# Patient Record
Sex: Female | Born: 1995
Health system: Southern US, Community
[De-identification: ages and names within clinical notes are randomized; demographics above are authoritative.]

## PROBLEM LIST (undated history)

## (undated) DIAGNOSIS — D649 Anemia, unspecified: Secondary | ICD-10-CM

## (undated) DIAGNOSIS — E282 Polycystic ovarian syndrome: Secondary | ICD-10-CM

## (undated) HISTORY — DX: Anemia, unspecified: D64.9

## (undated) HISTORY — DX: Polycystic ovarian syndrome: E28.2

---
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
## 2014-11-27 DIAGNOSIS — D649 Anemia, unspecified: Secondary | ICD-10-CM

## 2014-11-27 HISTORY — DX: Anemia, unspecified: D64.9

## 2017-05-16 ENCOUNTER — Ambulatory Visit (INDEPENDENT_AMBULATORY_CARE_PROVIDER_SITE_OTHER): Payer: BLUE CROSS/BLUE SHIELD | Admitting: Sports Medicine

## 2017-05-16 ENCOUNTER — Encounter: Payer: Self-pay | Admitting: Sports Medicine

## 2017-05-16 ENCOUNTER — Ambulatory Visit (INDEPENDENT_AMBULATORY_CARE_PROVIDER_SITE_OTHER): Payer: BLUE CROSS/BLUE SHIELD

## 2017-05-16 VITALS — BP 120/80 | HR 98 | Ht 66.0 in | Wt 166.0 lb

## 2017-05-16 DIAGNOSIS — S99911A Unspecified injury of right ankle, initial encounter: Secondary | ICD-10-CM

## 2017-05-16 DIAGNOSIS — S99921A Unspecified injury of right foot, initial encounter: Secondary | ICD-10-CM | POA: Diagnosis not present

## 2017-05-16 DIAGNOSIS — S93491A Sprain of other ligament of right ankle, initial encounter: Secondary | ICD-10-CM | POA: Diagnosis not present

## 2017-05-16 LAB — POCT URINE PREGNANCY: Preg Test, Ur: NEGATIVE

## 2017-05-16 NOTE — Progress Notes (Signed)
OFFICE VISIT NOTE Sherry FellsMichael D. Delorise Shinerigby, DO  [facility] [place] [phone]  Sherry Ruiz - 21 y.o. female MRN [id number]  Date of birth: 09/30/1996  Visit Date: 05/16/2017  PCP: No primary care provider on file.   Referred by: No ref. provider found  Clovis CaoAutumn McNeil, cma acting as scribe for Dr. Berline Choughigby.  SUBJECTIVE:   Chief Complaint  Patient presents with  . New Patient (Initial Visit)  . Left Ankle Injury   HPI: As below and per problem based documentation when appropriate.   Sherry Ruiz reports a LT ankle injury on 05/02/2017. Pt was dancing and rolled her ankle inward. Her 3 popping sounds with immediate swelling. Location is mainly on the lateral side with occasionally pain in the toe area. Bruising has resolved for the most part--there is still discoloration over her toes, and medial hill area. Since incident she has tried to ice 3 times per day. After incident she did ace wrap it and used crutches. Now Wears an immobilizer with some relief. She does elevate the leg throughout the day. Uses Ibuprofen only prn. Typically when walking in the morning is the worst part of her day with the pain levels. Pain at rest is a 2-3. When walking in the morning it is a 7. During the day it is a 4-5.    Review of Systems  Constitutional: Negative for chills, diaphoresis, fever, malaise/fatigue and weight loss.  HENT: Negative.   Eyes: Negative.   Respiratory: Negative.   Cardiovascular: Negative.   Gastrointestinal: Negative.   Genitourinary: Negative.   Musculoskeletal: Positive for joint pain.  Skin: Negative for itching and rash.  Neurological: Negative.  Negative for weakness.  Endo/Heme/Allergies: Negative.   Psychiatric/Behavioral: Negative.     Otherwise per HPI.  HISTORY & PERTINENT PRIOR DATA:  No specialty comments available. She reports that she has never smoked. She has never used smokeless tobacco. No results for input(s): HGBA1C, LABURIC in  the last 8760 hours. Medications & Allergies reviewed per EMR Patient Active Problem List   Diagnosis Date Noted  . Sprain of anterior talofibular ligament of right ankle 05/16/2017   History reviewed. No pertinent past medical history. History reviewed. No pertinent family history. History reviewed. No pertinent surgical history. Social History   Occupational History  . Not on file.   Social History Main Topics  . Smoking status: Never Smoker  . Smokeless tobacco: Never Used  . Alcohol use Yes     Comment: 1-2 drinks per week  . Drug use: No  . Sexual activity: Not on file    OBJECTIVE:  VS:  HT:5\' 6"  (167.6 cm)   WT:166 lb (75.3 kg)  BMI:26.8    BP:120/80  HR:98bpm  TEMP: ( )  RESP:98 % EXAM: Findings:  WDWN, NAD, Non-toxic appearing Alert & appropriately interactive Not depressed or anxious appearing No increased work of breathing. Pupils are equal. EOM intact without nystagmus No clubbing or cyanosis of the extremities appreciated No significant rashes/lesions/ulcerations overlying the examined area. DP & PT pulses 2+/4.  No significant pretibial edema. Sensation intact to light touch in lower extremities.    Right ankle: Overall well appearing a significant deformity. She is a small amount of swelling along the anterolateral ankle with focal TTP over the anterior lateral malleolus.  Small amount of pain with Klier testing but this is minimal.  She does have a slight instability with anterior drawer testing is solid endpoint.  This is painful for her.  Intrinsic ankle strength does have some dysmetria although this is minimal.  Range of motion is full and unrestricted.     Dg Ankle Complete Right  Result Date: 05/16/2017 CLINICAL DATA:  Rolled RIGHT foot and ankle while dancing on 05/02/2017, lateral ankle pain radiating to lateral RIGHT foot EXAM: RIGHT ANKLE - COMPLETE 3+ VIEW COMPARISON:  None FINDINGS: Soft tissue swelling laterally and anteriorly. Osseous  mineralization normal. Joint spaces preserved. Small plantar calcaneal spur. No acute fracture, dislocation, or bone destruction. IMPRESSION: No acute osseous abnormalities. Electronically Signed   By: Ulyses Southward M.D.   On: 05/16/2017 15:18   Dg Foot Complete Right  Result Date: 05/16/2017 CLINICAL DATA:  Rolled RIGHT foot and ankle while dancing on 05/02/2017, lateral ankle pain radiating to lateral RIGHT foot EXAM: RIGHT FOOT COMPLETE - 3+ VIEW COMPARISON:  None FINDINGS: Osseous mineralization normal. Joint spaces preserved. No acute fracture, dislocation, or bone destruction. Tiny plantar calcaneal spur. IMPRESSION: No acute osseous abnormalities. Electronically Signed   By: Ulyses Southward M.D.   On: 05/16/2017 15:19   ASSESSMENT & PLAN:   Problem List Items Addressed This Visit    Sprain of anterior talofibular ligament of right ankle    Symptoms are consistent with severe anterior lateral ankle sprain.  No evidence of syndesmosis instability.  Have her continue with the ASO and begin working on therapeutic exercises.  OTC NSAIDs as needed and avoidance of exacerbating activities.  Cautioned on the risk of recurrent injury in the first 6 months following ankle sprain we will plan to follow-up only as needed given her busy work schedule.       Other Visit Diagnoses    Injury of right foot, initial encounter    -  Primary   Relevant Orders   DG Foot Complete Right (Completed)   POCT urine pregnancy (Completed)   Injury of right ankle, initial encounter       Relevant Orders   DG Ankle Complete Right (Completed)   DG Foot Complete Right (Completed)   POCT urine pregnancy (Completed)      Follow-up: Return if symptoms worsen or fail to improve.   CMA/ATC served as Neurosurgeon during this visit. History, Physical, and Plan performed by medical provider. Documentation and orders reviewed and attested to.      Gaspar Bidding, DO    [facility]

## 2017-05-16 NOTE — Patient Instructions (Signed)
Please perform the exercise program that we have prepared for you and gone over in detail on a daily basis.  In addition to the handout you were provided you can access your program through: [email or web address]   Your unique program code is: [email or web address] 

## 2017-05-16 NOTE — Assessment & Plan Note (Signed)
Symptoms are consistent with severe anterior lateral ankle sprain.  No evidence of syndesmosis instability.  Have her continue with the ASO and begin working on therapeutic exercises.  OTC NSAIDs as needed and avoidance of exacerbating activities.  Cautioned on the risk of recurrent injury in the first 6 months following ankle sprain we will plan to follow-up only as needed given her busy work schedule.

## 2017-05-18 ENCOUNTER — Encounter: Payer: Self-pay | Admitting: Physician Assistant

## 2017-05-18 ENCOUNTER — Ambulatory Visit (INDEPENDENT_AMBULATORY_CARE_PROVIDER_SITE_OTHER): Payer: BLUE CROSS/BLUE SHIELD | Admitting: Physician Assistant

## 2017-05-18 ENCOUNTER — Other Ambulatory Visit (HOSPITAL_COMMUNITY)
Admission: RE | Admit: 2017-05-18 | Discharge: 2017-05-18 | Disposition: A | Discharge status: Home / Self Care | Payer: BLUE CROSS/BLUE SHIELD | Source: Ambulatory Visit | Attending: Physician Assistant | Admitting: Physician Assistant

## 2017-05-18 VITALS — BP 130/90 | HR 81 | Temp 99.2°F | Ht 66.0 in | Wt 164.0 lb

## 2017-05-18 DIAGNOSIS — Z01419 Encounter for gynecological examination (general) (routine) without abnormal findings: Secondary | ICD-10-CM | POA: Diagnosis present

## 2017-05-18 DIAGNOSIS — F419 Anxiety disorder, unspecified: Secondary | ICD-10-CM | POA: Diagnosis not present

## 2017-05-18 DIAGNOSIS — Z13220 Encounter for screening for lipoid disorders: Secondary | ICD-10-CM | POA: Diagnosis not present

## 2017-05-18 DIAGNOSIS — Z0001 Encounter for general adult medical examination with abnormal findings: Secondary | ICD-10-CM

## 2017-05-18 DIAGNOSIS — Z136 Encounter for screening for cardiovascular disorders: Secondary | ICD-10-CM

## 2017-05-18 DIAGNOSIS — D649 Anemia, unspecified: Secondary | ICD-10-CM | POA: Diagnosis not present

## 2017-05-18 DIAGNOSIS — Z124 Encounter for screening for malignant neoplasm of cervix: Secondary | ICD-10-CM

## 2017-05-18 DIAGNOSIS — Z0000 Encounter for general adult medical examination without abnormal findings: Secondary | ICD-10-CM

## 2017-05-18 DIAGNOSIS — H6122 Impacted cerumen, left ear: Secondary | ICD-10-CM | POA: Diagnosis not present

## 2017-05-18 DIAGNOSIS — Z789 Other specified health status: Secondary | ICD-10-CM

## 2017-05-18 MED ORDER — SERTRALINE HCL 25 MG PO TABS: 25.0000 mg | ORAL_TABLET | Freq: Every day | ORAL | 0 refills | Status: DC

## 2017-05-18 NOTE — Progress Notes (Signed)
SCRIBE STATEMENT  Subjective:    Sherry Ruiz is a 21 y.o. female and is here for a comprehensive physical exam.  HPI  Health Maintenance Due  Topic Date Due  . HIV Screening  06/06/2009  . PAP SMEAR  06/07/2015   Sherry Ruiz is here to establish care today and have a complete physical done. It has been quite sometime since she has had a physical done, she does not have an OB/GYN.  Acute Concerns: None  Chronic Issues: Vegetarian -- does not take B12 supplements, has been vegetarian since age 21 because she was diagnosed with hyperlipidemia Anemia -- she reports that she has a history of having low ferritin, denies any excessive blood loss. She currently uses NuvaRing and does not take any breaks from this, and thus does not get a period. Anxiety -- started during college, unable to relax, tries to distract self when she gets anxiety, gets panic attacks where she has difficulty breathing, has been decreasing in severity, can come and go without cause, seems "random" when it appears. Has tried CBD but has not always worked. Avoids alcohol when she has these symptoms to not become dependent on it. Her GAD-7 is 12 today. She reports that when she does have anxiety it is very severe, she states that she will consider herself anxious about 60% of the time.   Health Maintenance: Immunizations -- up to date Colonoscopy -- no family hx, start at age 21 Mammogram -- has not had any breast lumps, does not perform self breast exams, no personal history of early ultrasounds/mammograms PAP -- normal, several years ago Diet -- vegetarian diet, loves to eat beans, rice, broccoli; will eat fish and eggs occasionally; tries to limit cheese, loves peanut butter Caffeine intake -- no caffeine except for occasional chocolate Sleep habits -- usually 8-9 hours of a night Exercise -- lots of walking during the summer (4-10 miles), during the winter goes to gym Weight -- Weight: 164 lb (74.4 kg) -- normal for  her Mood -- no concerns of depression/suicide, does endorse anxiety (see above) Last period -- 03/16/2017 Period characteristics -- does not have Birth control -- currently on [employer], she has been told by another provider that she may or may not have PCOS, has been told that she has elevated androgen levels  Depression screen PHQ 2/9 05/18/2017  Decreased Interest 0  Down, Depressed, Hopeless 0  PHQ - 2 Score 0   GAD 7 : Generalized Anxiety Score 05/18/2017  Nervous, Anxious, on Edge 1  Control/stop worrying 2  Worry too much - different things 2  Trouble relaxing 2  Restless 2  Easily annoyed or irritable 1  Afraid - awful might happen 2  Total GAD 7 Score 12   Other providers/specialists: Dr. Gaspar BiddingMichael Rigby -- sports medicine provider  PMHx, SurgHx, SocialHx, Medications, and Allergies were reviewed in the Visit Navigator and updated as appropriate.   Past Medical History:  Diagnosis Date  . Anemia 2016  . PCOS (polycystic ovarian syndrome)      Past Surgical History:  Procedure Laterality Date  . BLADDER SURGERY  1998     Family History  Problem Relation Age of Onset  . High Cholesterol Mother   . Kidney failure Mother        kidney transplant  . Breast cancer Maternal Aunt   . Colon cancer Neg Hx     Social History  Substance Use Topics  . Smoking status: Never Smoker  . Smokeless tobacco:  Never Used  . Alcohol use Yes     Comment: 1-2 drinks per week    Review of Systems:   Review of Systems  Constitutional: Negative for chills, fever, malaise/fatigue and weight loss.  HENT: Negative for hearing loss, sinus pain and sore throat.   Respiratory: Negative for cough and hemoptysis.   Cardiovascular: Negative for chest pain, palpitations, leg swelling and PND.  Gastrointestinal: Negative for abdominal pain, constipation, diarrhea, heartburn, nausea and vomiting.  Genitourinary: Negative for dysuria, frequency and urgency.  Musculoskeletal: Negative for  back pain, myalgias and neck pain.  Skin: Negative for itching and rash.  Neurological: Negative for dizziness, tingling, seizures and headaches.  Endo/Heme/Allergies: Negative for polydipsia.  Psychiatric/Behavioral: Negative for depression. The patient is nervous/anxious.      Objective:   BP 130/90 (BP Location: Left Arm, Patient Position: Sitting, Cuff Size: Normal)   Pulse 81   Temp 99.2 F (37.3 C) (Oral)   Ht 5\' 6"  (1.676 m)   Wt 164 lb (74.4 kg)   LMP 03/16/2017   SpO2 97%   BMI 26.47 kg/m   General Appearance:    Alert, cooperative, no distress, appears stated age  Head:    Normocephalic, without obvious abnormality, atraumatic  Eyes:    PERRL, conjunctiva/corneas clear, EOM's intact, fundi    benign, both eyes  Ears:    Left ear with cerumen impaction, R TM normal and external ear canals, both ears  Nose:   Nares normal, septum midline, mucosa normal, no drainage    or sinus tenderness  Throat:   Lips, mucosa, and tongue normal; teeth and gums normal  Neck:   Supple, symmetrical, trachea midline, no adenopathy;    thyroid:  no enlargement/tenderness/nodules; no carotid   bruit or JVD  Back:     Symmetric, no curvature, ROM normal, no CVA tenderness  Lungs:     Clear to auscultation bilaterally, respirations unlabored  Chest Wall:    No tenderness or deformity   Heart:    Regular rate and rhythm, S1 and S2 normal, no murmur, rub   or gallop  Breast Exam:    No tenderness, masses, or nipple abnormality  Abdomen:     Soft, non-tender, bowel sounds active all four quadrants,    no masses, no organomegaly  Genitalia:    Normal female without lesion, discharge or tenderness  Rectal:    Deferred  Extremities:   Extremities normal, atraumatic, no cyanosis or edema; Ankle brace to right ankle present   Pulses:   2+ and symmetric all extremities  Skin:   Skin color, texture, turgor normal, no rashes or lesions  Lymph nodes:   Cervical, supraclavicular, and axillary nodes  normal  Neurologic:   CNII-XII intact, normal strength, sensation and reflexes    throughout    Procedure: Cerumen Disimpaction Warm water applied and gentle ear lavage performed by CMA on the left. There were no complications and following the disimpaction the tympanic membrane was visible on the left. Tympanic membranes are intact following the procedure.  Auditory canals are normal.  The patient reported relief of symptoms after removal of cerumen.   Assessment/Plan:   Sherry Ruiz was seen today for establish care.  Diagnoses and all orders for this visit:  Routine general medical examination at a health care facility Today patient counseled on age appropriate routine health concerns for screening and prevention, each reviewed and up to date or declined. Immunizations reviewed and up to date or declined. Labs ordered and reviewed. Risk  factors for depression reviewed and negative. Hearing function and visual acuity are intact. ADLs screened and addressed as needed. Functional ability and level of safety reviewed and appropriate. Education, counseling and referrals performed based on assessed risks today. Patient provided with a copy of personalized plan for preventive services. -     Comprehensive metabolic panel; Future -     CBC with Differential/Platelet; Future  Cervical cancer screening -     Cytology - PAP  Encounter for lipid screening for cardiovascular disease -     Lipid panel; Future  Anxiety She is agreeable to starting 25 mg of Zoloft today. I'm going to have her follow-up after about 6 weeks, we will discuss the effectiveness of medication at that time. She denies any homicidal or suicidal thoughts. We'll check routine labs as well as TSH. -     TSH; Future  Vegetarian diet Discussed addition of multivitamin with B12. We will check her B12 level when she will return for labs. -     Vitamin B12; Future  Anemia, unspecified type Currently asymptomatic. She does not have  regular menstrual periods. We'll check CBC with labs, discussed maybe using a oral multivitamin with iron given her vegetarianism.  Impacted cerumen of left ear Patient tolerated procedure well. Tympanic membrane without cerumen present after procedure. Discussed using Debrox on a regular basis if symptoms return.  Other orders -     sertraline (ZOLOFT) 25 MG tablet; Take 1 tablet (25 mg total) by mouth daily.  Well Adult Exam: Labs ordered: Yes. Patient counseling was done. See below for items discussed. Discussed the patient's BMI. The BMI BMI is in the acceptable range Follow up in 6 weeks.  Patient Counseling:   [x]     Nutrition: Stressed importance of moderation in sodium/caffeine intake, saturated fat and cholesterol, caloric balance, sufficient intake of fresh fruits, vegetables, fiber, calcium, iron, and 1 mg of folate supplement per day (for females capable of pregnancy).   [x]      Stressed the importance of regular exercise.    []     Substance Abuse: Discussed cessation/primary prevention of tobacco, alcohol, or other drug use; driving or other dangerous activities under the influence; availability of treatment for abuse.    [x]      Injury prevention: Discussed safety belts, safety helmets, smoke detector, smoking near bedding or upholstery.    []      Sexuality: Discussed sexually transmitted diseases, partner selection, use of condoms, avoidance of unintended pregnancy  and contraceptive alternatives.    [x]     Dental health: Discussed importance of regular tooth brushing, flossing, and dental visits.   [x]      Health maintenance and immunizations reviewed. Please refer to Health maintenance section.   CMA or LPN served as scribe during this visit. History, Physical, and Plan performed by medical provider. Documentation and orders reviewed and attested to.  Jarold Motto, PA-C [facility]

## 2017-05-18 NOTE — Patient Instructions (Addendum)
It was great meeting you today!  Start taking a multivitamin with B12.  Please make an appointment with the lab on your way out. I would like for you to return for lab work within 1-2 weeks. After midnight on the day of the lab draw, please do not eat anything. You may have water, black coffee, unsweetened tea.      Health Maintenance, Female Adopting a healthy lifestyle and getting preventive care can go a long way to promote health and wellness. Talk with your health care provider about what schedule of regular examinations is right for you. This is a good chance for you to check in with your provider about disease prevention and staying healthy. In between checkups, there are plenty of things you can do on your own. Experts have done a lot of research about which lifestyle changes and preventive measures are most likely to keep you healthy. Ask your health care provider for more information. Weight and diet Eat a healthy diet  Be sure to include plenty of vegetables, fruits, low-fat dairy products, and lean protein.  Do not eat a lot of foods high in solid fats, added sugars, or salt.  Get regular exercise. This is one of the most important things you can do for your health. ? Most adults should exercise for at least 150 minutes each week. The exercise should increase your heart rate and make you sweat (moderate-intensity exercise). ? Most adults should also do strengthening exercises at least twice a week. This is in addition to the moderate-intensity exercise.  Maintain a healthy weight  Body mass index (BMI) is a measurement that can be used to identify possible weight problems. It estimates body fat based on height and weight. Your health care provider can help determine your BMI and help you achieve or maintain a healthy weight.  For females 84 years of age and older: ? A BMI below 18.5 is considered underweight. ? A BMI of 18.5 to 24.9 is normal. ? A BMI of 25 to 29.9 is  considered overweight. ? A BMI of 30 and above is considered obese.  Watch levels of cholesterol and blood lipids  You should start having your blood tested for lipids and cholesterol at 23 years of age, then have this test every 5 years.  You may need to have your cholesterol levels checked more often if: ? Your lipid or cholesterol levels are high. ? You are older than 23 years of age. ? You are at high risk for heart disease.  Cancer screening Lung Cancer  Lung cancer screening is recommended for adults 8-58 years old who are at high risk for lung cancer because of a history of smoking.  A yearly low-dose CT scan of the lungs is recommended for people who: ? Currently smoke. ? Have quit within the past 15 years. ? Have at least a 30-pack-year history of smoking. A pack year is smoking an average of one pack of cigarettes a day for 1 year.  Yearly screening should continue until it has been 15 years since you quit.  Yearly screening should stop if you develop a health problem that would prevent you from having lung cancer treatment.  Breast Cancer  Practice breast self-awareness. This means understanding how your breasts normally appear and feel.  It also means doing regular breast self-exams. Let your health care provider know about any changes, no matter how small.  If you are in your 20s or 30s, you should have a clinical  breast exam (CBE) by a health care provider every 1-3 years as part of a regular health exam.  If you are 82 or older, have a CBE every year. Also consider having a breast X-ray (mammogram) every year.  If you have a family history of breast cancer, talk to your health care provider about genetic screening.  If you are at high risk for breast cancer, talk to your health care provider about having an MRI and a mammogram every year.  Breast cancer gene (BRCA) assessment is recommended for women who have family members with BRCA-related cancers.  BRCA-related cancers include: ? Breast. ? Ovarian. ? Tubal. ? Peritoneal cancers.  Results of the assessment will determine the need for genetic counseling and BRCA1 and BRCA2 testing.  Cervical Cancer Your health care provider may recommend that you be screened regularly for cancer of the pelvic organs (ovaries, uterus, and vagina). This screening involves a pelvic examination, including checking for microscopic changes to the surface of your cervix (Pap test). You may be encouraged to have this screening done every 3 years, beginning at age 47.  For women ages 43-65, health care providers may recommend pelvic exams and Pap testing every 3 years, or they may recommend the Pap and pelvic exam, combined with testing for human papilloma virus (HPV), every 5 years. Some types of HPV increase your risk of cervical cancer. Testing for HPV may also be done on women of any age with unclear Pap test results.  Other health care providers may not recommend any screening for nonpregnant women who are considered low risk for pelvic cancer and who do not have symptoms. Ask your health care provider if a screening pelvic exam is right for you.  If you have had past treatment for cervical cancer or a condition that could lead to cancer, you need Pap tests and screening for cancer for at least 20 years after your treatment. If Pap tests have been discontinued, your risk factors (such as having a new sexual partner) need to be reassessed to determine if screening should resume. Some women have medical problems that increase the chance of getting cervical cancer. In these cases, your health care provider may recommend more frequent screening and Pap tests.  Colorectal Cancer  This type of cancer can be detected and often prevented.  Routine colorectal cancer screening usually begins at 22 years of age and continues through 23 years of age.  Your health care provider may recommend screening at an earlier age if  you have risk factors for colon cancer.  Your health care provider may also recommend using home test kits to check for hidden blood in the stool.  A small camera at the end of a tube can be used to examine your colon directly (sigmoidoscopy or colonoscopy). This is done to check for the earliest forms of colorectal cancer.  Routine screening usually begins at age 62.  Direct examination of the colon should be repeated every 5-10 years through 23 years of age. However, you may need to be screened more often if early forms of precancerous polyps or small growths are found.  Skin Cancer  Check your skin from head to toe regularly.  Tell your health care provider about any new moles or changes in moles, especially if there is a change in a mole's shape or color.  Also tell your health care provider if you have a mole that is larger than the size of a pencil eraser.  Always use sunscreen. Apply  sunscreen liberally and repeatedly throughout the day.  Protect yourself by wearing long sleeves, pants, a wide-brimmed hat, and sunglasses whenever you are outside.  Heart disease, diabetes, and high blood pressure  High blood pressure causes heart disease and increases the risk of stroke. High blood pressure is more likely to develop in: ? People who have blood pressure in the high end of the normal range (130-139/85-89 mm Hg). ? People who are overweight or obese. ? People who are African American.  If you are 43-63 years of age, have your blood pressure checked every 3-5 years. If you are 19 years of age or older, have your blood pressure checked every year. You should have your blood pressure measured twice-once when you are at a hospital or clinic, and once when you are not at a hospital or clinic. Record the average of the two measurements. To check your blood pressure when you are not at a hospital or clinic, you can use: ? An automated blood pressure machine at a pharmacy. ? A home blood  pressure monitor.  If you are between 76 years and 62 years old, ask your health care provider if you should take aspirin to prevent strokes.  Have regular diabetes screenings. This involves taking a blood sample to check your fasting blood sugar level. ? If you are at a normal weight and have a low risk for diabetes, have this test once every three years after 23 years of age. ? If you are overweight and have a high risk for diabetes, consider being tested at a younger age or more often. Preventing infection Hepatitis B  If you have a higher risk for hepatitis B, you should be screened for this virus. You are considered at high risk for hepatitis B if: ? You were born in a country where hepatitis B is common. Ask your health care provider which countries are considered high risk. ? Your parents were born in a high-risk country, and you have not been immunized against hepatitis B (hepatitis B vaccine). ? You have HIV or AIDS. ? You use needles to inject street drugs. ? You live with someone who has hepatitis B. ? You have had sex with someone who has hepatitis B. ? You get hemodialysis treatment. ? You take certain medicines for conditions, including cancer, organ transplantation, and autoimmune conditions.  Hepatitis C  Blood testing is recommended for: ? Everyone born from [birth year] through 1967. ? Anyone with known risk factors for hepatitis C.  Sexually transmitted infections (STIs)  You should be screened for sexually transmitted infections (STIs) including gonorrhea and chlamydia if: ? You are sexually active and are younger than 23 years of age. ? You are older than 23 years of age and your health care provider tells you that you are at risk for this type of infection. ? Your sexual activity has changed since you were last screened and you are at an increased risk for chlamydia or gonorrhea. Ask your health care provider if you are at risk.  If you do not have HIV, but are at risk,  it may be recommended that you take a prescription medicine daily to prevent HIV infection. This is called pre-exposure prophylaxis (PrEP). You are considered at risk if: ? You are sexually active and do not regularly use condoms or know the HIV status of your partner(s). ? You take drugs by injection. ? You are sexually active with a partner who has HIV.  Talk with your health care provider  about whether you are at high risk of being infected with HIV. If you choose to begin PrEP, you should first be tested for HIV. You should then be tested every 3 months for as long as you are taking PrEP. Pregnancy  If you are premenopausal and you may become pregnant, ask your health care provider about preconception counseling.  If you may become pregnant, take 400 to 800 micrograms (mcg) of folic acid every day.  If you want to prevent pregnancy, talk to your health care provider about birth control (contraception). Osteoporosis and menopause  Osteoporosis is a disease in which the bones lose minerals and strength with aging. This can result in serious bone fractures. Your risk for osteoporosis can be identified using a bone density scan.  If you are 38 years of age or older, or if you are at risk for osteoporosis and fractures, ask your health care provider if you should be screened.  Ask your health care provider whether you should take a calcium or vitamin D supplement to lower your risk for osteoporosis.  Menopause may have certain physical symptoms and risks.  Hormone replacement therapy may reduce some of these symptoms and risks. Talk to your health care provider about whether hormone replacement therapy is right for you. Follow these instructions at home:  Schedule regular health, dental, and eye exams.  Stay current with your immunizations.  Do not use any tobacco products including cigarettes, chewing tobacco, or electronic cigarettes.  If you are pregnant, do not drink  alcohol.  If you are breastfeeding, limit how much and how often you drink alcohol.  Limit alcohol intake to no more than 1 drink per day for nonpregnant women. One drink equals 12 ounces of beer, 5 ounces of wine, or 1 ounces of hard liquor.  Do not use street drugs.  Do not share needles.  Ask your health care provider for help if you need support or information about quitting drugs.  Tell your health care provider if you often feel depressed.  Tell your health care provider if you have ever been abused or do not feel safe at home. This information is not intended to replace advice given to you by your health care provider. Make sure you discuss any questions you have with your health care provider. Document Released: 05/29/2011 Document Revised: 04/20/2016 Document Reviewed: 08/17/2015 Elsevier Interactive Patient Education  [employer].

## 2017-05-18 NOTE — Progress Notes (Deleted)
Berton MountSarah Rommel is a 21 y.o. female here to Establish Care.  I acted as a Neurosurgeonscribe for [employer]Samantha Worley, PA-C Corky Mullonna Rahn Lacuesta, LPN  History of Present Illness:   Chief Complaint  Patient presents with  . Establish Care    BC/BS    Acute Concerns:   Chronic Issues:   Health Maintenance: Immunizations -- *** Colonoscopy -- *** Mammogram -- *** PAP -- *** Bone Density -- *** PSA -- *** Diet -- *** Caffeine intake -- *** Sleep habits -- *** Exercise -- *** Weight -- Weight: 164 lb (74.4 kg)  Mood -- ***  [facility] 05/18/2017  Decreased Interest 0  Down, Depressed, Hopeless 0  PHQ - 2 Score 0    No flowsheet data found.   Other providers/specialists:   Past Medical History:  Diagnosis Date  . Anemia 2016  . Anxiety 2016  . Hyperlipidemia    age 21, went on vegetarian diet     Social History   Social History  . Marital status: Single    Spouse name: N/A  . Number of children: N/A  . Years of education: N/A   Occupational History  . Not on file.   Social History Main Topics  . Smoking status: Never Smoker  . Smokeless tobacco: Never Used  . Alcohol use Yes     Comment: 1-2 drinks per week  . Drug use: No  . Sexual activity: Yes    Birth control/ protection: Inserts   Other Topics Concern  . Not on file   Social History Narrative  . No narrative on file    Past Surgical History:  Procedure Laterality Date  . BLADDER SURGERY  1998    History reviewed. No pertinent family history.  No Known Allergies   Current Medications:   Current Outpatient Prescriptions:  .  NUVARING 0.12-0.015 MG/24HR vaginal ring, , Disp: , Rfl: 0   Review of Systems:   Review of Systems  Constitutional: Negative for chills, fever, malaise/fatigue and weight loss.  HENT: Negative for hearing loss, sinus pain and sore throat.   Eyes: Negative for blurred vision.  Respiratory: Negative for cough and shortness of breath.   Cardiovascular: Negative  for chest pain, palpitations and leg swelling.  Gastrointestinal: Negative for abdominal pain, constipation, diarrhea, heartburn, nausea and vomiting.  Genitourinary: Negative for dysuria, frequency and urgency.  Musculoskeletal: Negative for back pain, myalgias and neck pain.  Skin: Negative for itching and rash.  Neurological: Positive for headaches. Negative for dizziness, tingling, seizures and loss of consciousness.       Hx Migraines every couple of months  Endo/Heme/Allergies: Negative for polydipsia.  Psychiatric/Behavioral: Negative for depression. The patient is not nervous/anxious.     Vitals:   Vitals:   05/18/17 1437  BP: 130/90  Pulse: 81  Temp: 99.2 F (37.3 C)  TempSrc: Oral  SpO2: 97%  Weight: 164 lb (74.4 kg)  Height: 5\' 6"  (1.676 m)     Body mass index is 26.47 kg/m.  Physical Exam:   Physical Exam  Assessment and Plan:    There are no diagnoses linked to this encounter.  . Reviewed expectations re: course of current medical issues. . Discussed self-management of symptoms. . Outlined signs and symptoms indicating need for more acute intervention. . Patient verbalized understanding and all questions were answered. . See orders for this visit as documented in the electronic medical record. . Patient received an After-Visit Summary.  CMA or LPN served as scribe during this visit.  History, Physical, and Plan performed by medical provider. Documentation and orders reviewed and attested to.  Inda Coke, PA-C

## 2017-05-22 LAB — CYTOLOGY - PAP: Diagnosis: NEGATIVE

## 2017-05-31 ENCOUNTER — Other Ambulatory Visit (INDEPENDENT_AMBULATORY_CARE_PROVIDER_SITE_OTHER): Payer: BLUE CROSS/BLUE SHIELD

## 2017-05-31 DIAGNOSIS — Z136 Encounter for screening for cardiovascular disorders: Secondary | ICD-10-CM

## 2017-05-31 DIAGNOSIS — Z0000 Encounter for general adult medical examination without abnormal findings: Secondary | ICD-10-CM | POA: Diagnosis not present

## 2017-05-31 DIAGNOSIS — F419 Anxiety disorder, unspecified: Secondary | ICD-10-CM | POA: Diagnosis not present

## 2017-05-31 DIAGNOSIS — Z13220 Encounter for screening for lipoid disorders: Secondary | ICD-10-CM

## 2017-05-31 DIAGNOSIS — Z789 Other specified health status: Secondary | ICD-10-CM | POA: Diagnosis not present

## 2017-05-31 LAB — COMPREHENSIVE METABOLIC PANEL
ALT: 20 U/L (ref 0–35)
AST: 17 U/L (ref 0–37)
Albumin: 4.4 g/dL (ref 3.5–5.2)
Alkaline Phosphatase: 51 U/L (ref 39–117)
BUN: 12 mg/dL (ref 6–23)
CHLORIDE: 104 meq/L (ref 96–112)
CO2: 27 meq/L (ref 19–32)
Calcium: 9.9 mg/dL (ref 8.4–10.5)
Creatinine, Ser: 0.94 mg/dL (ref 0.40–1.20)
GFR: 78.44 mL/min (ref >60.00)
GLUCOSE: 90 mg/dL (ref 70–99)
POTASSIUM: 4.7 meq/L (ref 3.5–5.1)
Sodium: 137 mEq/L (ref 135–145)
Total Bilirubin: 0.8 mg/dL (ref 0.2–1.2)
Total Protein: 7.2 g/dL (ref 6.0–8.3)

## 2017-05-31 LAB — CBC WITH DIFFERENTIAL/PLATELET
Basophils Absolute: 0.1 10*3/uL (ref 0.0–0.1)
Basophils Relative: 1.4 % (ref 0.0–3.0)
EOS PCT: 1.2 % (ref 0.0–5.0)
Eosinophils Absolute: 0.1 10*3/uL (ref 0.0–0.7)
HEMATOCRIT: 43.3 % (ref 36.0–46.0)
Hemoglobin: 15.1 g/dL — ABNORMAL HIGH (ref 12.0–15.0)
LYMPHS ABS: 1.3 10*3/uL (ref 0.7–4.0)
LYMPHS PCT: 31.2 % (ref 12.0–46.0)
MCHC: 34.8 g/dL (ref 30.0–36.0)
MCV: 94.6 fl (ref 78.0–100.0)
MONOS PCT: 6.9 % (ref 3.0–12.0)
Monocytes Absolute: 0.3 10*3/uL (ref 0.1–1.0)
NEUTROS ABS: 2.4 10*3/uL (ref 1.4–7.7)
NEUTROS PCT: 59.3 % (ref 43.0–77.0)
PLATELETS: 201 10*3/uL (ref 150.0–400.0)
RBC: 4.57 Mil/uL (ref 3.87–5.11)
RDW: 11.9 % (ref 11.5–15.5)
WBC: 4.1 10*3/uL (ref 4.0–10.5)

## 2017-05-31 LAB — VITAMIN B12: Vitamin B-12: 447 pg/mL (ref 211–911)

## 2017-05-31 LAB — LIPID PANEL
CHOL/HDL RATIO: 3
Cholesterol: 201 mg/dL — ABNORMAL HIGH (ref 0–200)
HDL: 73.8 mg/dL (ref >39.00)
LDL CALC: 108 mg/dL — AB (ref 0–99)
NonHDL: 127.16
Triglycerides: 95 mg/dL (ref 0.0–149.0)
VLDL: 19 mg/dL (ref 0.0–40.0)

## 2017-05-31 LAB — TSH: TSH: 0.87 u[IU]/mL (ref 0.35–4.50)

## 2017-08-06 ENCOUNTER — Encounter: Payer: Self-pay | Admitting: Physician Assistant

## 2017-08-14 NOTE — Progress Notes (Signed)
Sherry Ruiz is a 21 y.o. female is here for follow up on Anxiety.  I acted as a Neurosurgeon for [employer], PA-C Corky Mull, LPN  History of Present Illness:   Chief Complaint  Patient presents with  . Follow-up  . Anxiety    Anxiety  Presents for follow-up visit. Symptoms include decreased concentration, depressed mood, dizziness, dry mouth, excessive worry, feeling of choking, hyperventilation, irritability, malaise, muscle tension, nervous/anxious behavior, panic, restlessness and shortness of breath. Patient reports no insomnia, nausea, palpitations or suicidal ideas. Symptoms occur constantly (past 3-4 weeks). The most recent episode lasted 1 hour. The severity of symptoms is causing significant distress and moderate. The quality of sleep is good. Nighttime awakenings: once.   Compliance with medications is 76-100%. Side effects of treatment include GI discomfort.   She is interested in stopping her medication. Her fiance has moved in with her and she reports that things have been easier for her lifestyle since.  Patient's last menstrual period was 08/01/2017.  GAD 7 : Generalized Anxiety Score 08/15/2017 05/18/2017  Nervous, Anxious, on Edge 3 1  Control/stop worrying 3 2  Worry too much - different things 3 2  Trouble relaxing 3 2  Restless 2 2  Easily annoyed or irritable 3 1  Afraid - awful might happen 3 2  Total GAD 7 Score 20 12  Anxiety Difficulty Somewhat difficult -       Health Maintenance Due  Topic Date Due  . HIV Screening  06/06/2009    Past Medical History:  Diagnosis Date  . Anemia 2016  . PCOS (polycystic ovarian syndrome)      Social History   Social History  . Marital status: Single    Spouse name: N/A  . Number of children: N/A  . Years of education: N/A   Occupational History  . Not on file.   Social History Main Topics  . Smoking status: Never Smoker  . Smokeless tobacco: Never Used  . Alcohol use Yes     Comment: 1-2  drinks per week  . Drug use: No  . Sexual activity: Yes    Birth control/ protection: Inserts   Other Topics Concern  . 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 BLADDER SURGERY  1998    Family History  Problem Relation Age of Onset  . High Cholesterol Mother   . Kidney failure Mother        kidney transplant  . Breast cancer Maternal Aunt   . Hypertension Father   . Diabetes Maternal Grandmother   . CVA Maternal Grandmother   . Lung cancer Maternal Grandfather        tobacco abuse  . Colon cancer Neg Hx     PMHx, SurgHx, SocialHx, FamHx, Medications, and Allergies were reviewed in the Visit Navigator and updated as appropriate.   Patient Active Problem List   Diagnosis Date Noted  . Sprain of anterior talofibular ligament of right ankle 05/16/2017  . Anemia 11/27/2014    Social History  Substance Use Topics  . Smoking status: Never Smoker  . Smokeless tobacco: Never Used  . Alcohol use Yes     Comment: 1-2 drinks per week    Current Medications and Allergies:    Current Outpatient Prescriptions:  .  Multiple Vitamin (MULTIVITAMIN) tablet, Take 1 tablet by mouth daily., Disp: , Rfl:  .  NUVARING 0.12-0.015 MG/24HR vaginal ring, , Disp: , Rfl: 0 .  sertraline (ZOLOFT) 25 MG tablet, Take 1 tablet (25 mg total) by mouth daily., Disp: 90 tablet, Rfl: 0  No Known Allergies  Review of Systems   Review of Systems  Constitutional: Positive for irritability.  Respiratory: Positive for shortness of breath.   Cardiovascular: Negative for palpitations.  Gastrointestinal: Negative for nausea.  Neurological: Positive for dizziness.  Psychiatric/Behavioral: Positive for decreased concentration. Negative for suicidal ideas. The patient is nervous/anxious. The patient does not have insomnia.     Vitals:   Vitals:     08/15/17 0803  BP: 122/88  Pulse: 87  Temp: 98.6 F (37 C)  TempSrc: Oral  SpO2: 98%  Weight: 164 lb (74.4 kg)  Height:  (1.676 m)     Body mass index is 26.47 kg/m.   Physical Exam:    Physical Exam  Constitutional: She appears well-developed. She is cooperative.  Non-toxic appearance. She does not have a sickly appearance. She does not appear ill. No distress.  Cardiovascular: Normal rate, regular rhythm, S1 normal, S2 normal, normal heart sounds and normal pulses.   No LE edema  Pulmonary/Chest: Effort normal and breath sounds normal.  Neurological: She is alert. GCS eye subscore is 4. GCS verbal subscore is 5. GCS motor subscore is 6.  Skin: Skin is warm, dry and intact.  Psychiatric: She has a normal mood and affect. Her speech is normal and behavior is normal.  Nursing note and vitals reviewed.    Assessment and Plan:    Sherry Ruiz was seen today for follow-up and anxiety.  Diagnoses and all orders for this visit:  Anxiety Patient is interested in stopping medication. Advised patient as follows: Cut your tablets in half. Take half of a pill daily x 1 week. Then, take half of a pill every other day x 1 week. Then stop the medication. Follow-up if symptoms worsen or persist. I discussed with patient that if they develop any SI, to tell someone immediately and seek medical attention.  Need for prophylactic vaccination and inoculation against influenza -     Flu Vaccine QUAD 36+ mos IM    . Reviewed expectations re: course of current medical issues. . Discussed self-management of symptoms. . Outlined signs and symptoms indicating need for more acute intervention. . Patient verbalized understanding and all questions were answered. . See orders for this visit as documented in the electronic medical record. . Patient received an After Visit Summary.  CMA or LPN served as scribe during this visit. History, Physical, and Plan performed by medical provider.  Documentation and orders reviewed and attested to.  Jarold Motto, PA-C [place] [place] 08/15/2017  Follow-up: Return if symptoms worsen or fail to improve.

## 2017-08-15 ENCOUNTER — Encounter: Payer: Self-pay | Admitting: Physician Assistant

## 2017-08-15 ENCOUNTER — Ambulatory Visit (INDEPENDENT_AMBULATORY_CARE_PROVIDER_SITE_OTHER): Payer: BLUE CROSS/BLUE SHIELD | Admitting: Physician Assistant

## 2017-08-15 VITALS — BP 122/88 | HR 87 | Temp 98.6°F | Ht 66.0 in | Wt 164.0 lb

## 2017-08-15 DIAGNOSIS — Z23 Encounter for immunization: Secondary | ICD-10-CM

## 2017-08-15 DIAGNOSIS — F419 Anxiety disorder, unspecified: Secondary | ICD-10-CM

## 2017-08-15 NOTE — Patient Instructions (Signed)
It was great to see you!  Cut your tablets in half. Take half of a pill daily x 1 week. Then, take half of a pill every other day x 1 week. Then stop the medication. Follow-up if symptoms worsen or persist.  Panic Attacks Panic attacks are sudden, short-livedsurges of severe anxiety, fear, or discomfort. They may occur for no reason when you are relaxed, when you are anxious, or when you are sleeping. Panic attacks may occur for a number of reasons:  Healthy people occasionally have panic attacks in extreme, life-threatening situations, such as war or natural disasters. Normal anxiety is a protective mechanism of the body that helps [place] react to danger (fight or flight response).  Panic attacks are often seen with anxiety disorders, such as panic disorder, social anxiety disorder, generalized anxiety disorder, and phobias. Anxiety disorders cause excessive or uncontrollable anxiety. They may interfere with your relationships or other life activities.  Panic attacks are sometimes seen with other mental illnesses, such as depression and posttraumatic stress disorder.  Certain medical conditions, prescription medicines, and drugs of abuse can cause panic attacks.  What are the signs or symptoms? Panic attacks start suddenly, peak within 20 minutes, and are accompanied by four or more of the following symptoms:  Pounding heart or fast heart rate (palpitations).  Sweating.  Trembling or shaking.  Shortness of breath or feeling smothered.  Feeling choked.  Chest pain or discomfort.  Nausea or strange feeling in your stomach.  Dizziness, light-headedness, or feeling like you will faint.  Chills or hot flushes.  Numbness or tingling in your lips or hands and feet.  Feeling that things are not real or feeling that you are not yourself.  Fear of losing control or going crazy.  Fear of dying.  Some of these symptoms can mimic serious medical conditions. For example, you may think you  are having a heart attack. Although panic attacks can be very scary, they are not life threatening. How is this diagnosed? Panic attacks are diagnosed through an assessment by your health care provider. Your health care provider will ask questions about your symptoms, such as where and when they occurred. Your health care provider will also ask about your medical history and use of alcohol and drugs, including prescription medicines. Your health care provider may order blood tests or other studies to rule out a serious medical condition. Your health care provider may refer you to a mental health professional for further evaluation. How is this treated?  Most healthy people who have one or two panic attacks in an extreme, life-threatening situation will not require treatment.  The treatment for panic attacks associated with anxiety disorders or other mental illness typically involves counseling with a mental health professional, medicine, or a combination of both. Your health care provider will help determine what treatment is best for you.  Panic attacks due to physical illness usually go away with treatment of the illness. If prescription medicine is causing panic attacks, talk with your health care provider about stopping the medicine, decreasing the dose, or substituting another medicine.  Panic attacks due to alcohol or drug abuse go away with abstinence. Some adults need professional help in order to stop drinking or using drugs. Follow these instructions at home:  Take all medicines as directed by your health care provider.  Schedule and attend follow-up visits as directed by your health care provider. It is important to keep all your appointments. Contact a health care provider if:  You are  not able to take your medicines as prescribed.  Your symptoms do not improve or get worse. Get help right away if:  You experience panic attack symptoms that are different than your usual  symptoms.  You have serious thoughts about hurting yourself or others.  You are taking medicine for panic attacks and have a serious side effect. This information is not intended to replace advice given to you by your health care provider. Make sure you discuss any questions you have with your health care provider. Document Released: 11/13/2005 Document Revised: 04/20/2016 Document Reviewed: 06/27/2013 Elsevier Interactive Patient Education  2017 [employer].

## 2018-03-25 ENCOUNTER — Ambulatory Visit (INDEPENDENT_AMBULATORY_CARE_PROVIDER_SITE_OTHER): Payer: BLUE CROSS/BLUE SHIELD | Admitting: Physician Assistant

## 2018-03-25 ENCOUNTER — Encounter: Payer: Self-pay | Admitting: Physician Assistant

## 2018-03-25 VITALS — BP 120/80 | HR 90 | Temp 98.3°F | Ht 66.0 in | Wt 170.2 lb

## 2018-03-25 DIAGNOSIS — F419 Anxiety disorder, unspecified: Secondary | ICD-10-CM | POA: Diagnosis not present

## 2018-03-25 MED ORDER — ETONOGESTREL-ETHINYL ESTRADIOL 0.12-0.015 MG/24HR VA RING: VAGINAL_RING | VAGINAL | 3 refills | Status: AC

## 2018-03-25 NOTE — Patient Instructions (Addendum)
It was great to see you today!  Please schedule a physical after July 5th. We can discuss your anxiety then, sooner if needed.  Options for anxiety: 1. Lexapro (sister medication to Zoloft) Intermittent regimens: Luteal phase dosing regimen: Oral: 5 to 10 mg once daily during the luteal phase of menstrual cycle (beginning therapy 14 days before anticipated onset of menstruation and continued to the onset of menses); over the first month, may increase dose to 20 mg once daily during the luteal phase Symptom-onset dosing regimen: Oral: 5 to 10 mg once daily from the day of symptom-onset until a few days after the start of menses; over the first month, may increase dose based on response and tolerability to 20 mg once daily  2. Atarax (Hydroxyzine) -- sister medication to benadryl Take as needed, may make sleepy  3. Propranolol (beta blocker) 10-20 mg up to 3 times daily as needed  4. Changing birth control  5. Therapy  6. Ativan/Klonopin (as needed) Addictive, controlled substance, may make sleepy (I do not prescribe these long term, will only be able to provide a small prescription)

## 2018-03-25 NOTE — Progress Notes (Signed)
Sherry Ruiz is a 22 y.o. female is here to discuss: Anxiety  I acted as a Neurosurgeon for [employer], PA-C Corky Mull, LPN  History of Present Illness:   Chief Complaint  Patient presents with  . Anxiety    Anxiety  Presents for follow-up (Pt c/o anxiety getting worse past 4 months before menstral cycle and during menstral cycle) visit. Symptoms include decreased concentration, depressed mood (2-3 days prior to menstral cycle and during cycle), excessive worry, irritability, malaise, muscle tension and panic (2 during menstral cylce). Patient reports no chest pain, dizziness, dry mouth, insomnia, nausea, nervous/anxious behavior, obsessions, palpitations, restlessness, shortness of breath or suicidal ideas. Symptoms occur occasionally. The most recent episode lasted 1 day. The severity of symptoms is mild. The quality of sleep is fair. Nighttime awakenings: one to two.   Compliance with medications: Pt currently not taking anything, stopped in Sept due to side effects.   Symptoms are present for about 5 days a month. She states that she doesn't want to take something every day. She tries to exercise and eat well during these stressful periods, but its not the most effective for her symptoms. She felt like Zoloft made her symptoms worse.  GAD 7 : Generalized Anxiety Score 03/25/2018 08/15/2017 05/18/2017  Nervous, Anxious, on Edge Control/stop worrying Worry too much - different things Trouble relaxing Restless Easily annoyed or irritable Afraid - awful might happen Total GAD 7 Score Anxiety Difficulty Somewhat difficult Somewhat difficult -     Health Maintenance Due  Topic Date Due  . HIV Screening  06/06/2009    Past Medical History:  Diagnosis Date  . Anemia 2016  . PCOS (polycystic ovarian syndrome)      Social History   Socioeconomic History  . Marital status: Single    Spouse name: Not on file  .  Number of children: Not on file  . Years of education: Not on file  . Highest education level: Not on file  Occupational History  . Not on file  Social Needs  . Financial resource strain: Not on file  . Food insecurity:    Worry: Not on file    Inability: Not on file  . Transportation needs:    Medical: Not on file    Non-medical: Not on file  Tobacco Use  . Smoking status: Never Smoker  . Smokeless tobacco: Never Used  Substance and Sexual Activity  . Alcohol use: Yes    Comment: 1-2 drinks per week  . Drug use: No  . Sexual activity: Yes    Birth control/protection: Inserts  Lifestyle  . Physical activity:    Days per week: Not on file    Minutes per session: Not on file  . Stress: Not on file  Relationships  . Social connections:    Talks on phone: Not on file    Gets together: Not on file    Attends religious service: Not on file    Active member of club or organization: Not on file    Attends meetings of clubs or organizations: Not on file    Relationship status: Not on file  . Intimate partner violence:    Fear of current or ex partner: Not on file    Emotionally abused: Not on file    Physically abused: Not  on file    Forced sexual activity: Not on file  Other Topics Concern  . 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 BLADDER SURGERY  1998    Family History  Problem Relation Age of Onset  . High Cholesterol Mother   . Kidney failure Mother        kidney transplant  . Breast cancer Maternal Aunt   . Hypertension Father   . Diabetes Maternal Grandmother   . CVA Maternal Grandmother   . Lung cancer Maternal Grandfather        tobacco abuse  . Colon cancer Neg Hx     PMHx, SurgHx, SocialHx, FamHx, Medications, and Allergies were reviewed in the Visit Navigator and updated as appropriate.   Patient  Active Problem List   Diagnosis Date Noted  . Sprain of anterior talofibular ligament of right ankle 05/16/2017  . Anemia 11/27/2014    Social History   Tobacco Use  . Smoking status: Never Smoker  . Smokeless tobacco: Never Used  Substance Use Topics  . Alcohol use: Yes    Comment: 1-2 drinks per week  . Drug use: No    Current Medications and Allergies:    Current Outpatient Medications:  Marland Kitchen  Multiple Vitamin (MULTIVITAMIN) tablet, Take 1 tablet by mouth daily., Disp: , Rfl:  .  etonogestrel-ethinyl estradiol (NUVARING) 0.12-0.015 MG/24HR vaginal ring, Insert vaginally and leave in place for 3 consecutive weeks, then remove for 1 week., Disp: 1 each, Rfl: 3  No Known Allergies  Review of Systems   Review of Systems  Constitutional: Positive for irritability.  Respiratory: Negative for shortness of breath.   Cardiovascular: Negative for chest pain and palpitations.  Gastrointestinal: Negative for nausea.  Neurological: Negative for dizziness.  Psychiatric/Behavioral: Positive for decreased concentration. Negative for suicidal ideas. The patient is not nervous/anxious and does not have insomnia.     Vitals:   Vitals:   03/25/18 0910  BP: 120/80  Pulse: 90  Temp: 98.3 F (36.8 C)  TempSrc: Oral  SpO2: 98%  Weight: 170 lb 4 oz (77.2 kg)  Height:  (1.676 m)     Body mass index is 27.48 kg/m.   Physical Exam:    Physical Exam  Constitutional: She appears well-developed. She is cooperative.  Non-toxic appearance. She does not have a sickly appearance. She does not appear ill. No distress.  Cardiovascular: Normal rate, regular rhythm, S1 normal, S2 normal, normal heart sounds and normal pulses.  No LE edema  Pulmonary/Chest: Effort normal and breath sounds normal.  Neurological: She is alert. GCS eye subscore is 4. GCS verbal subscore is 5. GCS motor subscore is 6.  Skin: Skin is warm, dry and intact.  Psychiatric: She has a normal mood and affect. Her  speech is normal and behavior is normal.  Nursing note and vitals reviewed.    Assessment and Plan:    Sherry Ruiz was seen today for anxiety.  Diagnoses and all orders for this visit:  Anxiety  Other orders -     etonogestrel-ethinyl estradiol (NUVARING) 0.12-0.015 MG/24HR vaginal ring; Insert vaginally and leave in place for 3 consecutive weeks, then remove for 1 week.   Discussed options, including: changing birth control, taking intermittent Lexapro during luteal phase, propranolol prn, hydroxyzine prn, talk therapy, benzodiazepines. I discussed with patient that  I do not prescribe these long term, and will only be able to provide a small prescription. She would like to research these options and let us know via MyChart what she decides. Follow-up no later than July for CPE.  Marland Kitchen Reviewed expectations re: course of current medical issues. . Discussed self-management of symptoms. . Outlined signs and symptoms indicating need for more acute intervention. . Patient verbalized understanding and all questions were answered. . See orders for this visit as documented in the electronic medical record. . Patient received an After Visit Summary.  CMA or LPN served as scribe during this visit. History, Physical, and Plan performed by medical provider. Documentation and orders reviewed and attested to.  Jarold Motto, PA-C , [facility] 03/25/2018  Follow-up: Return in about 2 months (around 06/03/2018) for CPE, Medication F/U.

## 2018-06-28 ENCOUNTER — Ambulatory Visit (INDEPENDENT_AMBULATORY_CARE_PROVIDER_SITE_OTHER): Payer: BLUE CROSS/BLUE SHIELD | Admitting: Physician Assistant

## 2018-06-28 ENCOUNTER — Encounter: Payer: Self-pay | Admitting: Physician Assistant

## 2018-06-28 VITALS — BP 130/84 | HR 76 | Temp 98.0°F | Ht 65.5 in | Wt 162.0 lb

## 2018-06-28 DIAGNOSIS — Z3044 Encounter for surveillance of vaginal ring hormonal contraceptive device: Secondary | ICD-10-CM

## 2018-06-28 DIAGNOSIS — Z13220 Encounter for screening for lipoid disorders: Secondary | ICD-10-CM | POA: Diagnosis not present

## 2018-06-28 DIAGNOSIS — Z114 Encounter for screening for human immunodeficiency virus [HIV]: Secondary | ICD-10-CM | POA: Diagnosis not present

## 2018-06-28 DIAGNOSIS — F419 Anxiety disorder, unspecified: Secondary | ICD-10-CM | POA: Diagnosis not present

## 2018-06-28 DIAGNOSIS — Z136 Encounter for screening for cardiovascular disorders: Secondary | ICD-10-CM | POA: Diagnosis not present

## 2018-06-28 DIAGNOSIS — Z0000 Encounter for general adult medical examination without abnormal findings: Secondary | ICD-10-CM

## 2018-06-28 LAB — CBC WITH DIFFERENTIAL/PLATELET
BASOS ABS: 0.1 10*3/uL (ref 0.0–0.1)
Basophils Relative: 1.7 % (ref 0.0–3.0)
Eosinophils Absolute: 0 10*3/uL (ref 0.0–0.7)
Eosinophils Relative: 0.5 % (ref 0.0–5.0)
HCT: 43.4 % (ref 36.0–46.0)
Hemoglobin: 15.1 g/dL — ABNORMAL HIGH (ref 12.0–15.0)
LYMPHS PCT: 25.5 % (ref 12.0–46.0)
Lymphs Abs: 1.3 10*3/uL (ref 0.7–4.0)
MCHC: 34.7 g/dL (ref 30.0–36.0)
MCV: 96 fl (ref 78.0–100.0)
Monocytes Absolute: 0.3 10*3/uL (ref 0.1–1.0)
Monocytes Relative: 6.9 % (ref 3.0–12.0)
NEUTROS ABS: 3.3 10*3/uL (ref 1.4–7.7)
Neutrophils Relative %: 65.4 % (ref 43.0–77.0)
PLATELETS: 223 10*3/uL (ref 150.0–400.0)
RBC: 4.52 Mil/uL (ref 3.87–5.11)
RDW: 12.2 % (ref 11.5–15.5)
WBC: 5 10*3/uL (ref 4.0–10.5)

## 2018-06-28 LAB — COMPREHENSIVE METABOLIC PANEL
ALT: 14 U/L (ref 0–35)
AST: 14 U/L (ref 0–37)
Albumin: 4.4 g/dL (ref 3.5–5.2)
Alkaline Phosphatase: 46 U/L (ref 39–117)
BILIRUBIN TOTAL: 1 mg/dL (ref 0.2–1.2)
BUN: 14 mg/dL (ref 6–23)
CALCIUM: 10 mg/dL (ref 8.4–10.5)
CO2: 28 meq/L (ref 19–32)
Chloride: 104 mEq/L (ref 96–112)
Creatinine, Ser: 1.01 mg/dL (ref 0.40–1.20)
GFR: 71.54 mL/min (ref >60.00)
Glucose, Bld: 91 mg/dL (ref 70–99)
Potassium: 4.6 mEq/L (ref 3.5–5.1)
Sodium: 138 mEq/L (ref 135–145)
Total Protein: 7.1 g/dL (ref 6.0–8.3)

## 2018-06-28 LAB — LIPID PANEL
CHOL/HDL RATIO: 3
Cholesterol: 200 mg/dL (ref 0–200)
HDL: 79.3 mg/dL (ref >39.00)
LDL CALC: 102 mg/dL — AB (ref 0–99)
NonHDL: 120.77
TRIGLYCERIDES: 94 mg/dL (ref 0.0–149.0)
VLDL: 18.8 mg/dL (ref 0.0–40.0)

## 2018-06-28 MED ORDER — PROPRANOLOL HCL 10 MG PO TABS: 10.0000 mg | ORAL_TABLET | Freq: Three times a day (TID) | ORAL | 1 refills | Status: AC | PRN

## 2018-06-28 NOTE — Progress Notes (Signed)
I acted as a Neurosurgeon for [employer], PA-C Corky Mull, LPN  Subjective:    Sherry Ruiz is a 22 y.o. female and is here for a comprehensive physical exam.  HPI  Health Maintenance Due  Topic Date Due  . HIV Screening  06/06/2009    Acute Concerns: None  Chronic Issues: Anxiety -- once a month, she has panic attack-like symptoms including shortness of breath, chest pain.  She has been contemplating treatment, and would like to maybe start propranolol as needed. Contraceptive counseling -- she has been using the NuvaRing  for quite some time.  She is anticipating a job change where she may lose her benefits, she is contemplating a longer term contraception such as an IUD or Implanon.  Health Maintenance: Immunizations -- UTD Colonoscopy -- N/A Mammogram -- N/A PAP -- UTD done 05/18/2017 Normal Bone Density -- N/A Diet -- variable Sleep habits -- overall good sleep Exercise -- active regularly Current Weight -- Weight: 162 lb (73.5 kg)  Weight History: Wt Readings from Last 10 Encounters:  06/28/18 162 lb (73.5 kg)  03/25/18 170 lb 4 oz (77.2 kg)  08/15/17 164 lb (74.4 kg)  05/18/17 164 lb (74.4 kg)  05/16/17 166 lb (75.3 kg)  Mood -- overall good, occasional breakthrough anxiety Patient's last menstrual period was 06/26/2018. Period characteristics -- controlled  Depression screen PHQ 2/9 06/28/2018  Decreased Interest 0  Down, Depressed, Hopeless 0  PHQ - 2 Score 0   Other providers/specialists: Dentist -- UTD  PMHx, SurgHx, SocialHx, Medications, and Allergies were reviewed in the Visit Navigator and updated as appropriate.   Past Medical History:  Diagnosis Date  . Anemia 2016  . PCOS (polycystic ovarian syndrome)      Past Surgical History:  Procedure Laterality Date  . BLADDER SURGERY  1998     Family History  Problem Relation Age of Onset  . High Cholesterol Mother   . Kidney failure Mother        kidney transplant  . Other Mother      pre-diabetes  . Breast cancer Maternal Aunt   . Hypertension Father   . Diabetes Maternal Grandmother        Type 2  . CVA Maternal Grandmother   . Lung cancer Maternal Grandfather        tobacco abuse  . Colon cancer Neg Hx     Social History   Tobacco Use  . Smoking status: Never Smoker  . Smokeless tobacco: Never Used  Substance Use Topics  . Alcohol use: Yes    Comment: 1-2 drinks per week  . Drug use: No    Review of Systems:   Review of Systems  Constitutional: Negative.  Negative for chills, fever, malaise/fatigue and weight loss.  HENT: Negative.  Negative for hearing loss, sinus pain and sore throat.   Eyes: Negative.  Negative for blurred vision.  Respiratory: Negative.  Negative for cough and shortness of breath.   Cardiovascular: Negative.  Negative for chest pain, palpitations and leg swelling.  Gastrointestinal: Negative.  Negative for abdominal pain, constipation, diarrhea, heartburn, nausea and vomiting.  Genitourinary: Negative.  Negative for dysuria, frequency and urgency.  Musculoskeletal: Negative.  Negative for back pain, myalgias and neck pain.  Skin: Negative.  Negative for itching and rash.  Neurological: Negative.  Negative for dizziness, tingling, seizures, loss of consciousness and headaches.  Endo/Heme/Allergies: Negative.  Negative for polydipsia.  Psychiatric/Behavioral: Negative for depression. The patient is nervous/anxious.     Objective:  BP 130/84 (BP Location: Left Arm, Patient Position: Sitting, Cuff Size: Normal)   Pulse 76   Temp 98 F (36.7 C) (Oral)   Ht 5' 5.5" (1.664 m)   Wt 162 lb (73.5 kg)   LMP 06/26/2018   SpO2 98%   BMI 26.55 kg/m   General Appearance:    Alert, cooperative, no distress, appears stated age  Head:    Normocephalic, without obvious abnormality, atraumatic  Eyes:    PERRL, conjunctiva/corneas clear, EOM's intact, fundi    benign, both eyes  Ears:    Normal TM's and external ear canals, both ears    Nose:   Nares normal, septum midline, mucosa normal, no drainage    or sinus tenderness  Throat:   Lips, mucosa, and tongue normal; teeth and gums normal  Neck:   Supple, symmetrical, trachea midline, no adenopathy;    thyroid:  no enlargement/tenderness/nodules; no carotid   bruit or JVD  Back:     Symmetric, no curvature, ROM normal, no CVA tenderness  Lungs:     Clear to auscultation bilaterally, respirations unlabored  Chest Wall:    No tenderness or deformity   Heart:    Regular rate and rhythm, S1 and S2 normal, no murmur, rub   or gallop  Breast Exam:    No tenderness, masses, or nipple abnormality  Abdomen:     Soft, non-tender, bowel sounds active all four quadrants,    no masses, no organomegaly  Genitalia:    Deferred  Rectal:    Deferred  Extremities:   Extremities normal, atraumatic, no cyanosis or edema  Pulses:   2+ and symmetric all extremities  Skin:   Skin color, texture, turgor normal, no rashes or lesions  Lymph nodes:   Cervical, supraclavicular, and axillary nodes normal  Neurologic:   CNII-XII intact, normal strength, sensation and reflexes    throughout    Assessment/Plan:   Sherry Ruiz was seen today for annual exam.  Diagnoses and all orders for this visit:  Routine physical examination Today patient counseled on age appropriate routine health concerns for screening and prevention, each reviewed and up to date or declined. Immunizations reviewed and up to date or declined. Labs ordered and reviewed. Risk factors for depression reviewed and negative. Hearing function and visual acuity are intact. ADLs screened and addressed as needed. Functional ability and level of safety reviewed and appropriate. Education, counseling and referrals performed based on assessed risks today. Patient provided with a copy of personalized plan for preventive services.  Anxiety After discussion with patient, will start 10 mg propranolol as needed.  Patient is to follow-up if this does  not help her symptoms prior to her leaving for [place].  She is planning to establish with a new provider in 1 to 2 months after moving. -     CBC with Differential/Platelet -     Comprehensive metabolic panel  Screening for HIV (human immunodeficiency virus) -     HIV antibody  Encounter for lipid screening for cardiovascular disease -     Lipid panel  Contraception management I provided her with Dr. Nira Conn office number so she can call and make an appointment to pursue an IUD.  Other orders -     propranolol (INDERAL) 10 MG tablet; Take 1 tablet (10 mg total) by mouth 3 (three) times daily as needed.    Well Adult Exam: Labs ordered: Yes. Patient counseling was done. See below for items discussed. Discussed the patient's BMI. The BMI  BMI is not in the acceptable range; BMI management plan is completed Follow up as needed for acute illness.  Patient Counseling:   [x]     Nutrition: Stressed importance of moderation in sodium/caffeine intake, saturated fat and cholesterol, caloric balance, sufficient intake of fresh fruits, vegetables, fiber, calcium, iron, and 1 mg of folate supplement per day (for females capable of pregnancy).   [x]      Stressed the importance of regular exercise.    [x]     Substance Abuse: Discussed cessation/primary prevention of tobacco, alcohol, or other drug use; driving or other dangerous activities under the influence; availability of treatment for abuse.    [x]      Injury prevention: Discussed safety belts, safety helmets, smoke detector, smoking near bedding or upholstery.    [x]      Sexuality: Discussed sexually transmitted diseases, partner selection, use of condoms, avoidance of unintended pregnancy  and contraceptive alternatives.    [x]     Dental health: Discussed importance of regular tooth brushing, flossing, and dental visits.   [x]      Health maintenance and immunizations reviewed. Please refer to Health maintenance section.    CMA or LPN served as scribe during this visit. History, Physical, and Plan performed by medical provider. Documentation and orders reviewed and attested to.   Jarold MottoSamantha Ertha Nabor, PA-C [facility]

## 2018-06-28 NOTE — Patient Instructions (Addendum)
It was great to see you!  Dr. Talbert Nan or Dr. Dellis Filbert at [phone] [facility])  I hope your move to [place] goes well. If there is anything we can do for you, please let us know!  Health Maintenance, Female Adopting a healthy lifestyle and getting preventive care can go a long way to promote health and wellness. Talk with your health care provider about what schedule of regular examinations is right for you. This is a good chance for you to check in with your provider about disease prevention and staying healthy. In between checkups, there are plenty of things you can do on your own. Experts have done a lot of research about which lifestyle changes and preventive measures are most likely to keep you healthy. Ask your health care provider for more information. Weight and diet Eat a healthy diet  Be sure to include plenty of vegetables, fruits, low-fat dairy products, and lean protein.  Do not eat a lot of foods high in solid fats, added sugars, or salt.  Get regular exercise. This is one of the most important things you can do for your health. ? Most adults should exercise for at least 150 minutes each week. The exercise should increase your heart rate and make you sweat (moderate-intensity exercise). ? Most adults should also do strengthening exercises at least twice a week. This is in addition to the moderate-intensity exercise.  Maintain a healthy weight  Body mass index (BMI) is a measurement that can be used to identify possible weight problems. It estimates body fat based on height and weight. Your health care provider can help determine your BMI and help you achieve or maintain a healthy weight.  For females 62 years of age and older: ? A BMI below 18.5 is considered underweight. ? A BMI of 18.5 to 24.9 is normal. ? A BMI of 25 to 29.9 is considered overweight. ? A BMI of 30 and above is considered obese.  Watch levels of cholesterol and blood lipids  You  should start having your blood tested for lipids and cholesterol at 24 years of age, then have this test every 5 years.  You may need to have your cholesterol levels checked more often if: ? Your lipid or cholesterol levels are high. ? You are older than 24 years of age. ? You are at high risk for heart disease.  Cancer screening Lung Cancer  Lung cancer screening is recommended for adults 55-17 years old who are at high risk for lung cancer because of a history of smoking.  A yearly low-dose CT scan of the lungs is recommended for people who: ? Currently smoke. ? Have quit within the past 15 years. ? Have at least a 30-pack-year history of smoking. A pack year is smoking an average of one pack of cigarettes a day for 1 year.  Yearly screening should continue until it has been 15 years since you quit.  Yearly screening should stop if you develop a health problem that would prevent you from having lung cancer treatment.  Breast Cancer  Practice breast self-awareness. This means understanding how your breasts normally appear and feel.  It also means doing regular breast self-exams. Let your health care provider know about any changes, no matter how small.  If you are in your 20s or 30s, you should have a clinical breast exam (CBE) by a health care provider every 1-3 years as part of a regular health exam.  If you are 40 or older,  have a CBE every year. Also consider having a breast X-ray (mammogram) every year.  If you have a family history of breast cancer, talk to your health care provider about genetic screening.  If you are at high risk for breast cancer, talk to your health care provider about having an MRI and a mammogram every year.  Breast cancer gene (BRCA) assessment is recommended for women who have family members with BRCA-related cancers. BRCA-related cancers include: ? Breast. ? Ovarian. ? Tubal. ? Peritoneal cancers.  Results of the assessment will determine the  need for genetic counseling and BRCA1 and BRCA2 testing.  Cervical Cancer Your health care provider may recommend that you be screened regularly for cancer of the pelvic organs (ovaries, uterus, and vagina). This screening involves a pelvic examination, including checking for microscopic changes to the surface of your cervix (Pap test). You may be encouraged to have this screening done every 3 years, beginning at age 83.  For women ages 62-65, health care providers may recommend pelvic exams and Pap testing every 3 years, or they may recommend the Pap and pelvic exam, combined with testing for human papilloma virus (HPV), every 5 years. Some types of HPV increase your risk of cervical cancer. Testing for HPV may also be done on women of any age with unclear Pap test results.  Other health care providers may not recommend any screening for nonpregnant women who are considered low risk for pelvic cancer and who do not have symptoms. Ask your health care provider if a screening pelvic exam is right for you.  If you have had past treatment for cervical cancer or a condition that could lead to cancer, you need Pap tests and screening for cancer for at least 20 years after your treatment. If Pap tests have been discontinued, your risk factors (such as having a new sexual partner) need to be reassessed to determine if screening should resume. Some women have medical problems that increase the chance of getting cervical cancer. In these cases, your health care provider may recommend more frequent screening and Pap tests.  Colorectal Cancer  This type of cancer can be detected and often prevented.  Routine colorectal cancer screening usually begins at 24 years of age and continues through 24 years of age.  Your health care provider may recommend screening at an earlier age if you have risk factors for colon cancer.  Your health care provider may also recommend using home test kits to check for hidden blood  in the stool.  A small camera at the end of a tube can be used to examine your colon directly (sigmoidoscopy or colonoscopy). This is done to check for the earliest forms of colorectal cancer.  Routine screening usually begins at age 30.  Direct examination of the colon should be repeated every 5-10 years through 24 years of age. However, you may need to be screened more often if early forms of precancerous polyps or small growths are found.  Skin Cancer  Check your skin from head to toe regularly.  Tell your health care provider about any new moles or changes in moles, especially if there is a change in a mole's shape or color.  Also tell your health care provider if you have a mole that is larger than the size of a pencil eraser.  Always use sunscreen. Apply sunscreen liberally and repeatedly throughout the day.  Protect yourself by wearing long sleeves, pants, a wide-brimmed hat, and sunglasses whenever you are outside.  Heart disease, diabetes, and high blood pressure  High blood pressure causes heart disease and increases the risk of stroke. High blood pressure is more likely to develop in: ? People who have blood pressure in the high end of the normal range (130-139/85-89 mm Hg). ? People who are overweight or obese. ? People who are African American.  If you are 99-46 years of age, have your blood pressure checked every 3-5 years. If you are 24 years of age or older, have your blood pressure checked every year. You should have your blood pressure measured twice-once when you are at a hospital or clinic, and once when you are not at a hospital or clinic. Record the average of the two measurements. To check your blood pressure when you are not at a hospital or clinic, you can use: ? An automated blood pressure machine at a pharmacy. ? A home blood pressure monitor.  If you are between 69 years and 72 years old, ask your health care provider if you should take aspirin to prevent  strokes.  Have regular diabetes screenings. This involves taking a blood sample to check your fasting blood sugar level. ? If you are at a normal weight and have a low risk for diabetes, have this test once every three years after 24 years of age. ? If you are overweight and have a high risk for diabetes, consider being tested at a younger age or more often. Preventing infection Hepatitis B  If you have a higher risk for hepatitis B, you should be screened for this virus. You are considered at high risk for hepatitis B if: ? You were born in a country where hepatitis B is common. Ask your health care provider which countries are considered high risk. ? Your parents were born in a high-risk country, and you have not been immunized against hepatitis B (hepatitis B vaccine). ? You have HIV or AIDS. ? You use needles to inject street drugs. ? You live with someone who has hepatitis B. ? You have had sex with someone who has hepatitis B. ? You get hemodialysis treatment. ? You take certain medicines for conditions, including cancer, organ transplantation, and autoimmune conditions.  Hepatitis C  Blood testing is recommended for: ? Everyone born from [birth year] through 1967. ? Anyone with known risk factors for hepatitis C.  Sexually transmitted infections (STIs)  You should be screened for sexually transmitted infections (STIs) including gonorrhea and chlamydia if: ? You are sexually active and are younger than 24 years of age. ? You are older than 24 years of age and your health care provider tells you that you are at risk for this type of infection. ? Your sexual activity has changed since you were last screened and you are at an increased risk for chlamydia or gonorrhea. Ask your health care provider if you are at risk.  If you do not have HIV, but are at risk, it may be recommended that you take a prescription medicine daily to prevent HIV infection. This is called pre-exposure prophylaxis  (PrEP). You are considered at risk if: ? You are sexually active and do not regularly use condoms or know the HIV status of your partner(s). ? You take drugs by injection. ? You are sexually active with a partner who has HIV.  Talk with your health care provider about whether you are at high risk of being infected with HIV. If you choose to begin PrEP, you should first be tested for HIV.  You should then be tested every 3 months for as long as you are taking PrEP. Pregnancy  If you are premenopausal and you may become pregnant, ask your health care provider about preconception counseling.  If you may become pregnant, take 400 to 800 micrograms (mcg) of folic acid every day.  If you want to prevent pregnancy, talk to your health care provider about birth control (contraception). Osteoporosis and menopause  Osteoporosis is a disease in which the bones lose minerals and strength with aging. This can result in serious bone fractures. Your risk for osteoporosis can be identified using a bone density scan.  If you are 13 years of age or older, or if you are at risk for osteoporosis and fractures, ask your health care provider if you should be screened.  Ask your health care provider whether you should take a calcium or vitamin D supplement to lower your risk for osteoporosis.  Menopause may have certain physical symptoms and risks.  Hormone replacement therapy may reduce some of these symptoms and risks. Talk to your health care provider about whether hormone replacement therapy is right for you. Follow these instructions at home:  Schedule regular health, dental, and eye exams.  Stay current with your immunizations.  Do not use any tobacco products including cigarettes, chewing tobacco, or electronic cigarettes.  If you are pregnant, do not drink alcohol.  If you are breastfeeding, limit how much and how often you drink alcohol.  Limit alcohol intake to no more than 1 drink per day for  nonpregnant women. One drink equals 12 ounces of beer, 5 ounces of wine, or 1 ounces of hard liquor.  Do not use street drugs.  Do not share needles.  Ask your health care provider for help if you need support or information about quitting drugs.  Tell your health care provider if you often feel depressed.  Tell your health care provider if you have ever been abused or do not feel safe at home. This information is not intended to replace advice given to you by your health care provider. Make sure you discuss any questions you have with your health care provider. Document Released: 05/29/2011 Document Revised: 04/20/2016 Document Reviewed: 08/17/2015 Elsevier Interactive Patient Education  [employer].

## 2018-06-29 LAB — HIV ANTIBODY (ROUTINE TESTING W REFLEX): HIV: NONREACTIVE

## 2018-10-19 IMAGING — DX DG FOOT COMPLETE 3+V*R*
3 series · 3 of 3 positions shown · non-contrast
Comparison: None

CLINICAL DATA: Rolled RIGHT foot and ankle while dancing on
05/02/2017, lateral ankle pain radiating to lateral RIGHT foot

EXAM:
RIGHT FOOT COMPLETE - 3+ VIEW

[foot ap]
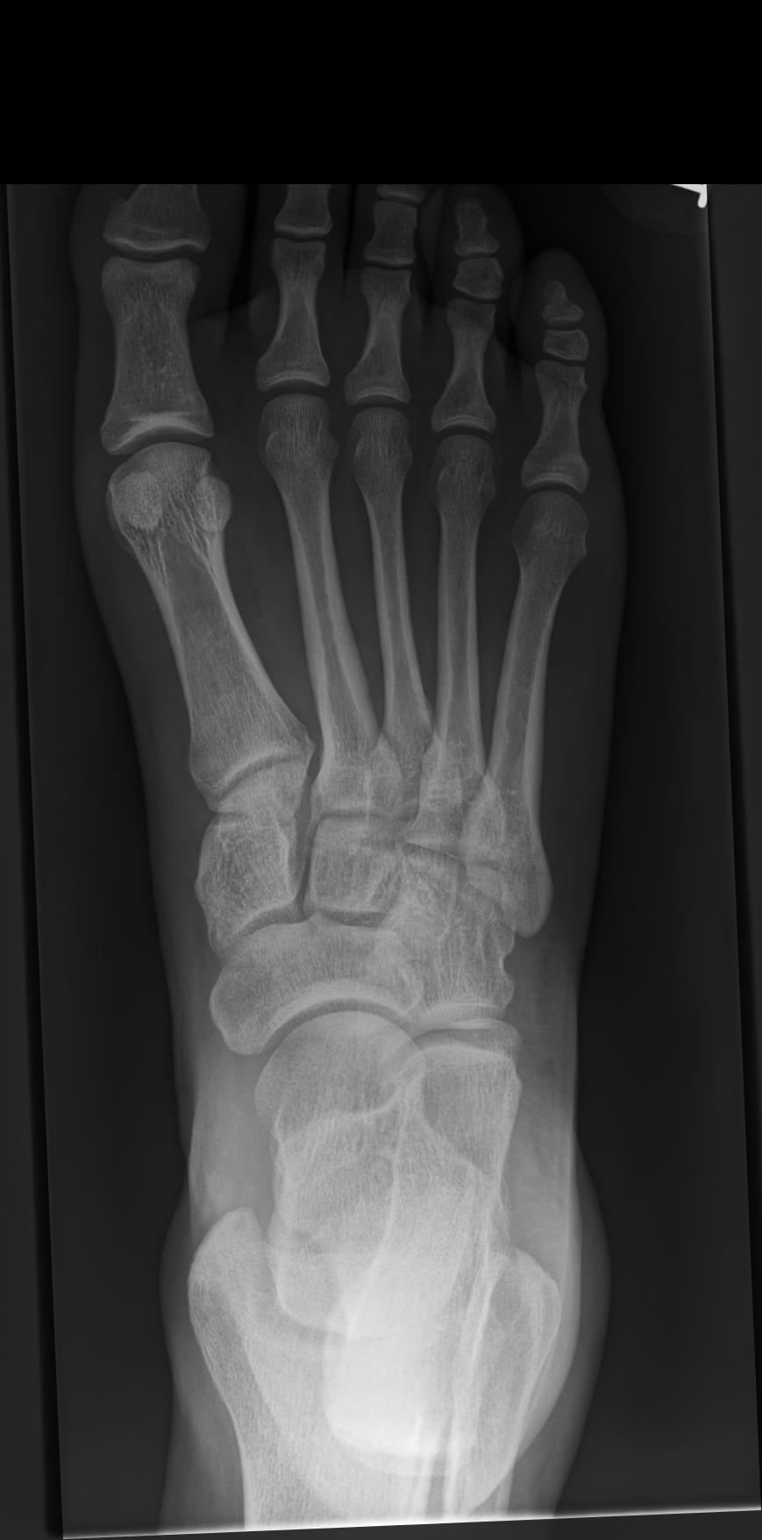

[foot oblique]
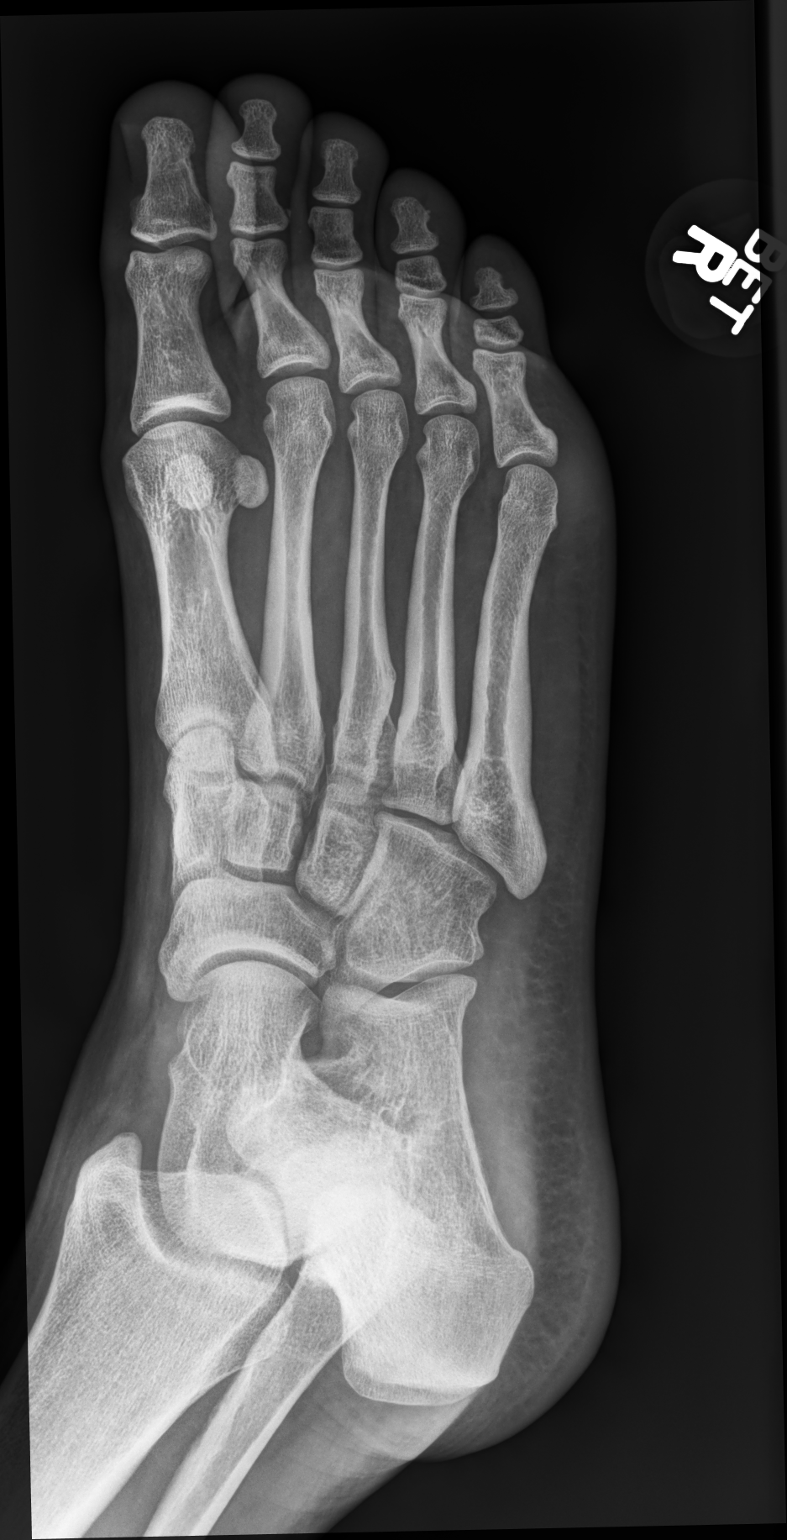

[foot lat]
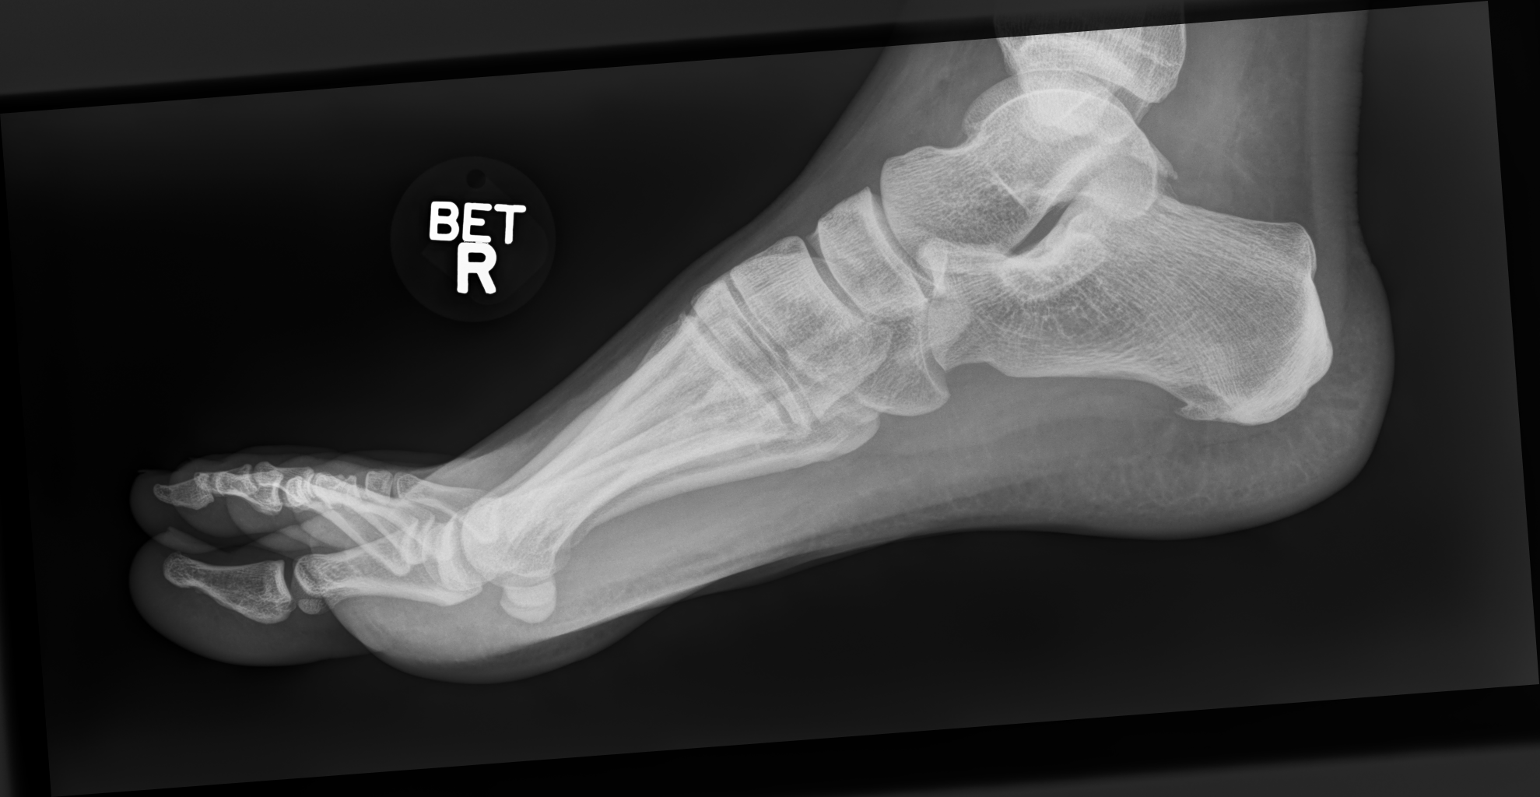

[3 of 3 positions shown; findings below may reference images not displayed]

FINDINGS: Osseous mineralization normal.

Joint spaces preserved.

No acute fracture, dislocation, or bone destruction.

Tiny plantar calcaneal spur.
IMPRESSION: No acute osseous abnormalities.

## 2018-10-19 IMAGING — DX DG ANKLE COMPLETE 3+V*R*
3 series · 3 of 3 positions shown · non-contrast
Comparison: None

CLINICAL DATA: Rolled RIGHT foot and ankle while dancing on
05/02/2017, lateral ankle pain radiating to lateral RIGHT foot

EXAM:
RIGHT ANKLE - COMPLETE 3+ VIEW

[ankle ap]
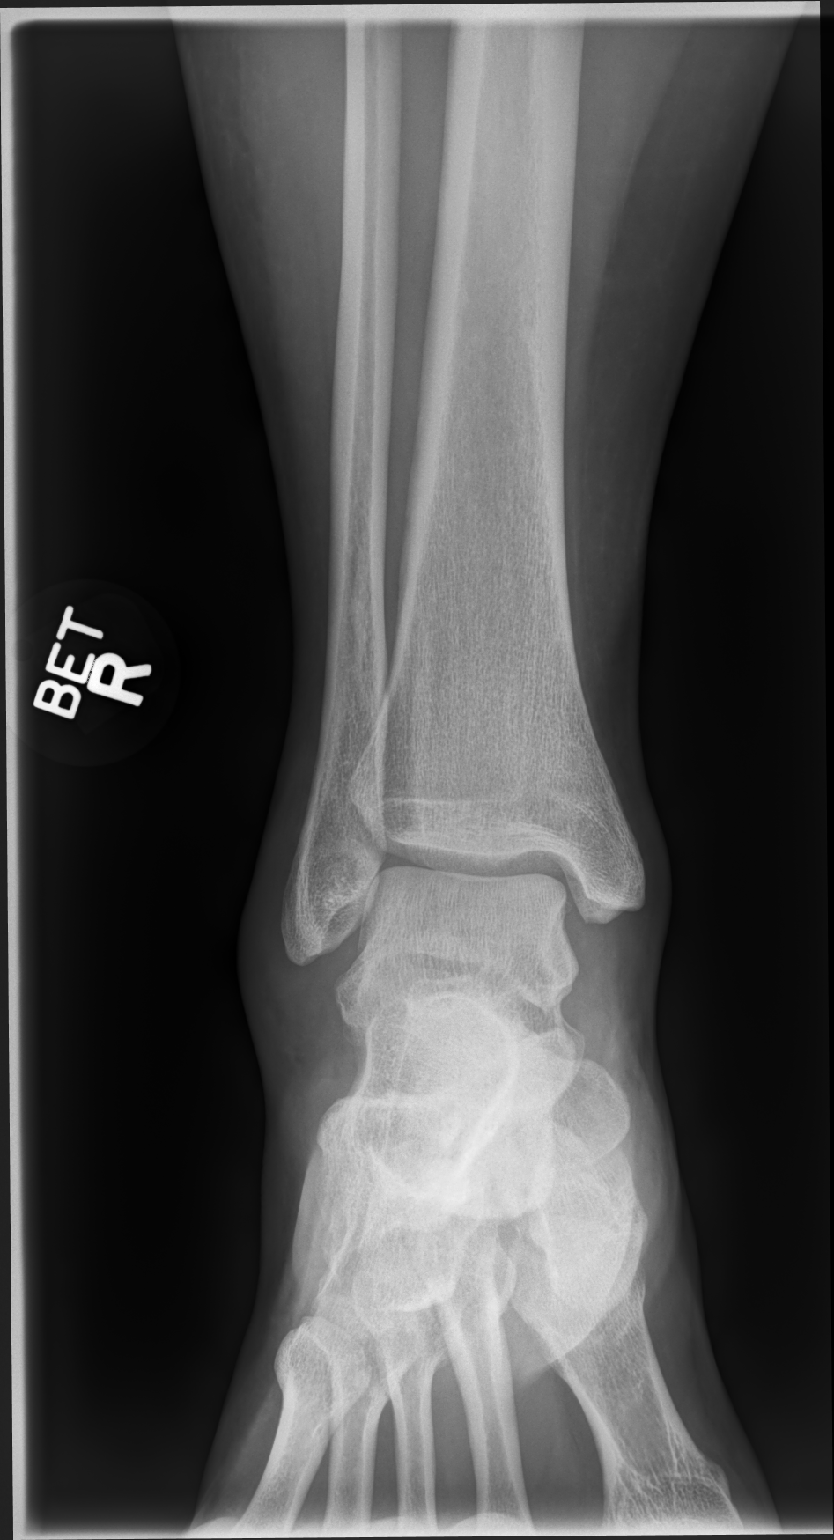

[ankle oblique]
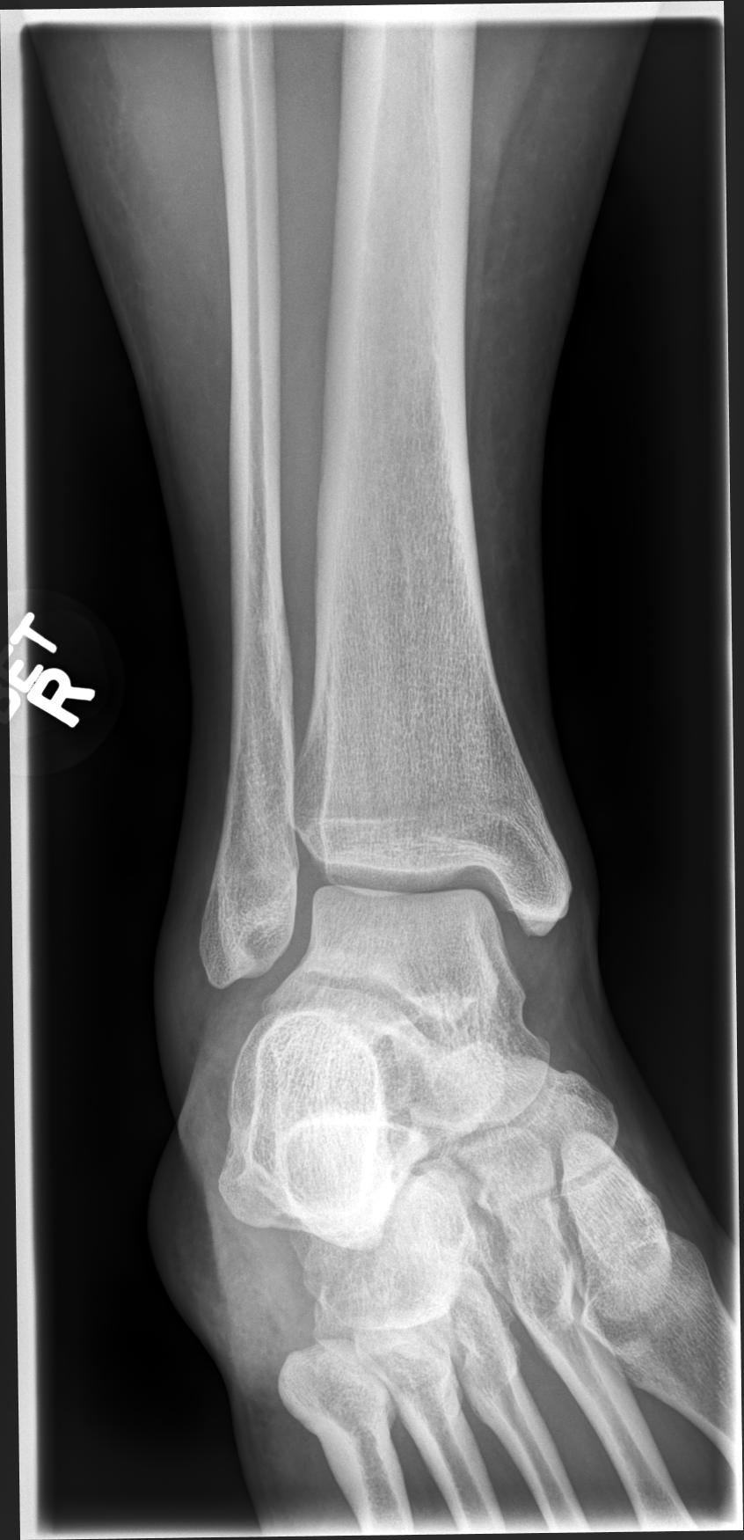

[ankle lat]
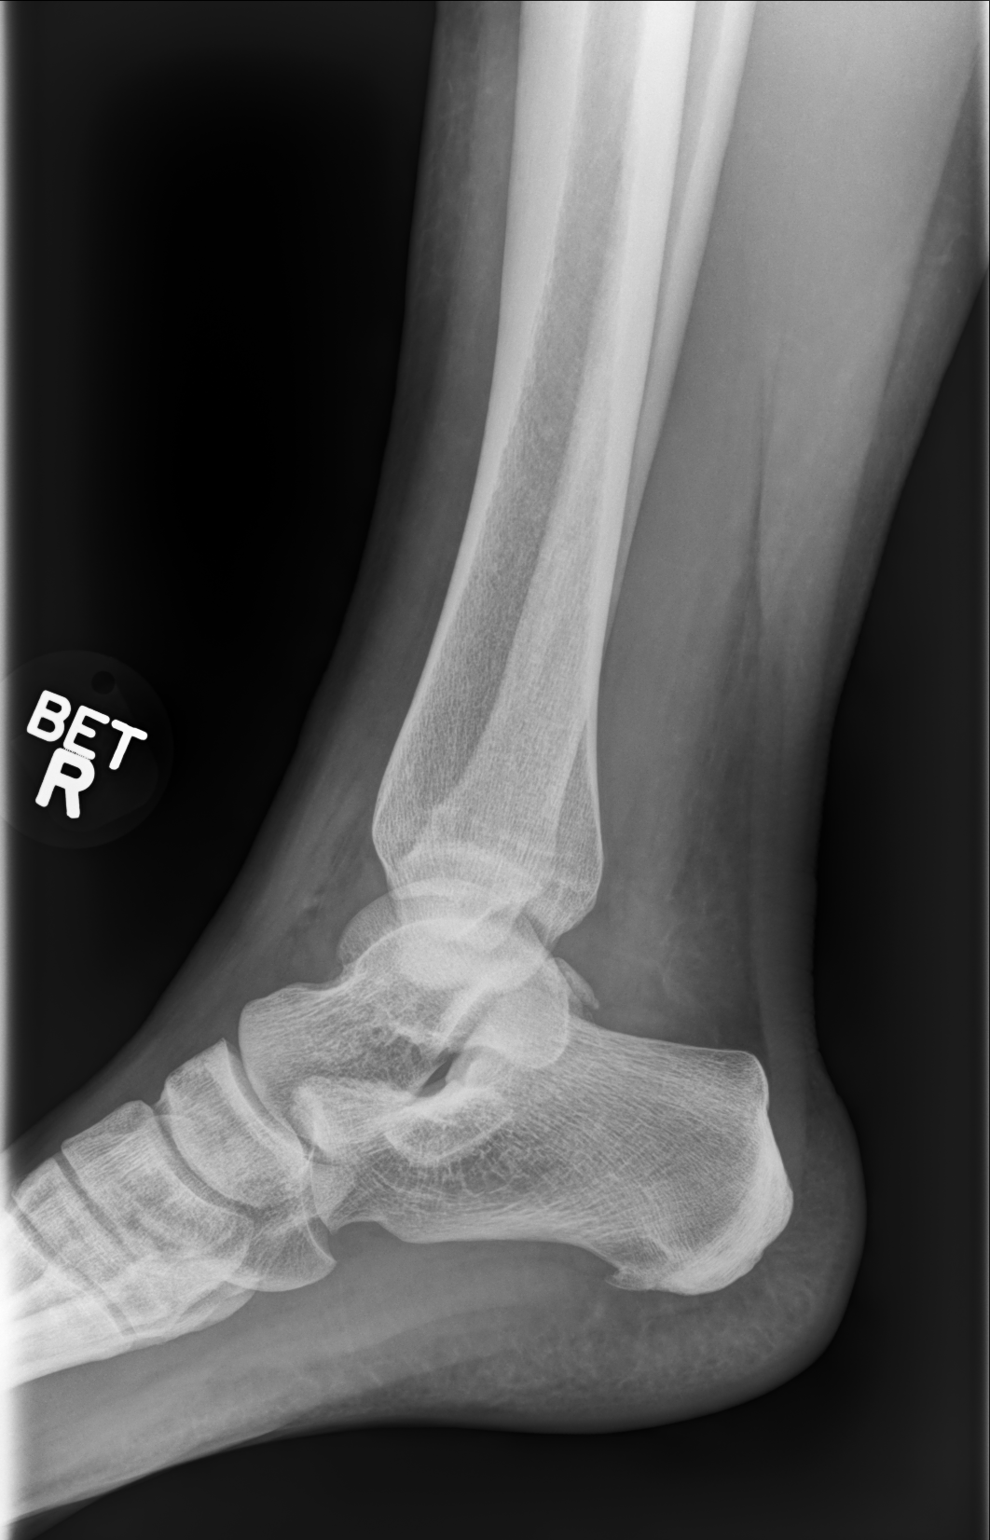

[3 of 3 positions shown; findings below may reference images not displayed]

FINDINGS: Soft tissue swelling laterally and anteriorly.

Osseous mineralization normal.

Joint spaces preserved.

Small plantar calcaneal spur.

No acute fracture, dislocation, or bone destruction.
IMPRESSION: No acute osseous abnormalities.
